# Patient Record
Sex: Male | Born: 1970 | Hispanic: Yes | Marital: Married | Smoking: Light tobacco smoker
Health system: Southern US, Community
[De-identification: ages and names within clinical notes are randomized; demographics above are authoritative.]

## PROBLEM LIST (undated history)

## (undated) DIAGNOSIS — E785 Hyperlipidemia, unspecified: Secondary | ICD-10-CM

## (undated) DIAGNOSIS — E119 Type 2 diabetes mellitus without complications: Secondary | ICD-10-CM

## (undated) DIAGNOSIS — I10 Essential (primary) hypertension: Secondary | ICD-10-CM

## (undated) HISTORY — DX: Hyperlipidemia, unspecified: E78.5

## (undated) HISTORY — DX: Essential (primary) hypertension: I10

## (undated) HISTORY — DX: Type 2 diabetes mellitus without complications: E11.9

---
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
## 2012-11-05 ENCOUNTER — Emergency Department (HOSPITAL_COMMUNITY): Payer: No Typology Code available for payment source

## 2012-11-05 ENCOUNTER — Emergency Department (HOSPITAL_COMMUNITY)
Admission: EM | Admit: 2012-11-05 | Discharge: 2012-11-06 | Disposition: A | Discharge status: Home / Self Care | Payer: Self-pay | Attending: Emergency Medicine | Admitting: Emergency Medicine

## 2012-11-05 ENCOUNTER — Encounter (HOSPITAL_COMMUNITY): Payer: Self-pay | Admitting: Emergency Medicine

## 2012-11-05 DIAGNOSIS — S40011A Contusion of right shoulder, initial encounter: Secondary | ICD-10-CM

## 2012-11-05 DIAGNOSIS — Y92410 Unspecified street and highway as the place of occurrence of the external cause: Secondary | ICD-10-CM | POA: Insufficient documentation

## 2012-11-05 DIAGNOSIS — S40019A Contusion of unspecified shoulder, initial encounter: Secondary | ICD-10-CM | POA: Insufficient documentation

## 2012-11-05 DIAGNOSIS — Y9389 Activity, other specified: Secondary | ICD-10-CM | POA: Insufficient documentation

## 2012-11-05 DIAGNOSIS — F17200 Nicotine dependence, unspecified, uncomplicated: Secondary | ICD-10-CM | POA: Insufficient documentation

## 2012-11-05 MED ORDER — IBUPROFEN 200 MG PO TABS
600.0000 mg | ORAL_TABLET | Freq: Once | ORAL | Status: AC
  Administered 2012-11-05: 600 mg via ORAL
  Filled 2012-11-05: qty 3
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
## 2012-11-06 MED ORDER — IBUPROFEN 600 MG PO TABS: 600.0000 mg | ORAL_TABLET | Freq: Four times a day (QID) | ORAL | Status: AC | PRN

## 2012-11-06 NOTE — ED Provider Notes (Signed)
Medical screening examination/treatment/procedure(s) were performed by non-physician practitioner and as supervising physician I was immediately available for consultation/collaboration.    Vida Roller, MD 11/06/12 2001

## 2015-02-27 DIAGNOSIS — I10 Essential (primary) hypertension: Secondary | ICD-10-CM

## 2015-02-27 HISTORY — DX: Essential (primary) hypertension: I10

## 2015-03-27 DIAGNOSIS — E785 Hyperlipidemia, unspecified: Secondary | ICD-10-CM

## 2015-03-27 DIAGNOSIS — E119 Type 2 diabetes mellitus without complications: Secondary | ICD-10-CM | POA: Insufficient documentation

## 2015-03-27 HISTORY — DX: Type 2 diabetes mellitus without complications: E11.9

## 2015-03-27 HISTORY — DX: Hyperlipidemia, unspecified: E78.5

## 2015-04-04 ENCOUNTER — Encounter: Payer: Self-pay | Admitting: Internal Medicine

## 2015-04-04 ENCOUNTER — Ambulatory Visit (INDEPENDENT_AMBULATORY_CARE_PROVIDER_SITE_OTHER): Payer: Self-pay | Admitting: Internal Medicine

## 2015-04-04 VITALS — BP 152/90 | HR 76 | Temp 98.4°F | Ht 63.5 in | Wt 151.5 lb

## 2015-04-04 DIAGNOSIS — R29898 Other symptoms and signs involving the musculoskeletal system: Secondary | ICD-10-CM

## 2015-04-04 DIAGNOSIS — R358 Other polyuria: Secondary | ICD-10-CM

## 2015-04-04 DIAGNOSIS — R3589 Other polyuria: Secondary | ICD-10-CM

## 2015-04-04 DIAGNOSIS — R030 Elevated blood-pressure reading, without diagnosis of hypertension: Secondary | ICD-10-CM

## 2015-04-04 DIAGNOSIS — G6289 Other specified polyneuropathies: Secondary | ICD-10-CM

## 2015-04-04 DIAGNOSIS — IMO0001 Reserved for inherently not codable concepts without codable children: Secondary | ICD-10-CM

## 2015-04-04 DIAGNOSIS — R5382 Chronic fatigue, unspecified: Secondary | ICD-10-CM

## 2015-04-04 DIAGNOSIS — R7611 Nonspecific reaction to tuberculin skin test without active tuberculosis: Secondary | ICD-10-CM

## 2015-04-04 NOTE — Patient Instructions (Signed)
[facility]ighter[facility] Naranja

## 2015-04-04 NOTE — Progress Notes (Signed)
Subjective:    Patient ID: Todd Foster, male    DOB: October 27, 1970, 45 y.o.   MRN: [id number]  HPI  1.  Feet have been burning and itching for 6-8 months.  Not peeling or flaking.  Notes this the most when trying to sleep.  Nothing seems to make this better.  Has this 2-3 times weekly.   6 months ago, was thirsty much of time, but stopped eating chocolate and now no longer bothers him.  Was urinating a lot 6 months ago as well.  Polyuria has also resolved since stopped eating chocolate.   He was eating 2-3 chocolate donuts/baked goods daily then.   Has lost 5 lbs since stopping the chocolate baked goods.   Mother has diabetes. No history of alcoholism--though drank more 5 years ago. Drinks once monthly Does not eat vegetables much Does eat a lot of fruit. Fish, shrimp, chicken, beef--previously fried, but has decreased in recent months.  2.  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  He never had the Chest Xray done and never followed up as was recommended.  He is concerned about this today as he wants to know if it has anything to do with his foot symptoms.  No cough or fever, no dyspnea.  3.  Knee Popping:  Does not have pain with the popping.    4.  Concern for Erectile Dysfunction:   Was having intercourse with wife 5 times weekly until last year.  In last year, only twice weekly.  His wife is not interested in having intercourse more often.  He admits to not being able to "concentrate"  At times, possibly because of this.  Does not masturbate to know if can have an erection otherwise.  He also has less interest in sex and in past year, feels more tired.  5.  Elevated BP:  Never noted to have elevated BP before, but has not been to a doctor.   Meds:  None  Allergies:  NKDA  Review of Systems     Objective:   Physical Exam  Constitutional: He appears well-developed and well-nourished.  HENT:  Head: Normocephalic and atraumatic.  Right Ear: Tympanic membrane and  external ear normal.  Left Ear: Tympanic membrane and external ear normal.  Nose: Nose normal.  Mouth/Throat: Oropharynx is clear and moist. Dental caries present.  Dental decay and periodontal disease  Eyes: Conjunctivae and EOM are normal. Pupils are equal, round, and reactive to light.  Discs sharp bilaterally.  No AV nicking  Neck: Normal range of motion. Neck supple. No thyroid mass and no thyromegaly present.  Cardiovascular: Normal rate, regular rhythm, S1 normal and S2 normal.  PMI is not displaced.  Exam reveals no S3, no S4 and no friction rub.   No murmur heard. No carotid bruits.  Carotid, radial, femoral, DP and PT pulses normal and equal.    Pulmonary/Chest: Effort normal and breath sounds normal. He has no wheezes.  Abdominal: Soft. Bowel sounds are normal. He exhibits no mass. There is no hepatosplenomegaly. There is no tenderness. No hernia. Hernia confirmed negative in the right inguinal area and confirmed negative in the left inguinal area.  Genitourinary: Testes normal and penis normal. Uncircumcised. No penile tenderness.  Erection during exam  Musculoskeletal: Normal range of motion. He exhibits no edema or tenderness.  Knees with full ROM, NT, NO crepitation, no effusion  Lymphadenopathy:    He has no cervical adenopathy.  Right: No inguinal adenopathy present.       Left: No inguinal adenopathy present.  Neurological: He is alert.  NOrmal 10 g monofilament testing of feet bilaterally  Skin: Skin is warm and dry.  Feet look healthy with good coloring and no deformity of feet or digits  Balding on head, but good amount of hair on chest, abdomen, pubic, leg and arm areas.          Assessment & Plan:  1.  History of +PPD:  Will call PHD--they may be able to get CXR for patient before can get him in for [facility].  Will Call PHD to confirm.  2.  Burning Feet:  Not sure if this is a peripheral neuropathy:  Check CBC, CMP, A1C, TSH  3.  Elevated BP:   Nurse visit for recheck in 1 month Follow up with me in 2 months  4.  Fatigue:  No findings on exam.  Labs as noted above  5.  Complaints regarding frequency of intercourse:  No findings on exam.  Do not feel this is likely due to ED.  6.  Knee popping:  No findings on exam.  Explained this can be normal.  Pt. It asymptomatic.

## 2015-04-05 LAB — COMPREHENSIVE METABOLIC PANEL
ALT: 23 IU/L (ref 0–44)
AST: 15 IU/L (ref 0–40)
Albumin/Globulin Ratio: 1.6 (ref 1.1–2.5)
Albumin: 4.7 g/dL (ref 3.5–5.5)
Alkaline Phosphatase: 87 IU/L (ref 39–117)
BUN/Creatinine Ratio: 15 (ref 9–20)
BUN: 12 mg/dL (ref 6–24)
Bilirubin Total: 1.4 mg/dL — ABNORMAL HIGH (ref 0.0–1.2)
CALCIUM: 9.6 mg/dL (ref 8.7–10.2)
CO2: 24 mmol/L (ref 18–29)
Chloride: 93 mmol/L — ABNORMAL LOW (ref 96–106)
Creatinine, Ser: 0.82 mg/dL (ref 0.76–1.27)
GFR calc Af Amer: 126 mL/min/{1.73_m2} (ref >59)
GFR, EST NON AFRICAN AMERICAN: 109 mL/min/{1.73_m2} (ref >59)
GLOBULIN, TOTAL: 2.9 g/dL (ref 1.5–4.5)
Glucose: 374 mg/dL — ABNORMAL HIGH (ref 65–99)
POTASSIUM: 4 mmol/L (ref 3.5–5.2)
SODIUM: 135 mmol/L (ref 134–144)
Total Protein: 7.6 g/dL (ref 6.0–8.5)

## 2015-04-05 LAB — CBC WITH DIFFERENTIAL/PLATELET
BASOS: 0 %
Basophils Absolute: 0 10*3/uL (ref 0.0–0.2)
EOS (ABSOLUTE): 0.1 10*3/uL (ref 0.0–0.4)
EOS: 1 %
HEMATOCRIT: 48.4 % (ref 37.5–51.0)
HEMOGLOBIN: 16.7 g/dL (ref 12.6–17.7)
IMMATURE GRANULOCYTES: 0 %
Immature Grans (Abs): 0 10*3/uL (ref 0.0–0.1)
Lymphocytes Absolute: 1.8 10*3/uL (ref 0.7–3.1)
Lymphs: 35 %
MCH: 30.2 pg (ref 26.6–33.0)
MCHC: 34.5 g/dL (ref 31.5–35.7)
MCV: 88 fL (ref 79–97)
MONOS ABS: 0.3 10*3/uL (ref 0.1–0.9)
Monocytes: 5 %
NEUTROS PCT: 59 %
Neutrophils Absolute: 3 10*3/uL (ref 1.4–7.0)
Platelets: 210 10*3/uL (ref 150–379)
RBC: 5.53 x10E6/uL (ref 4.14–5.80)
RDW: 13.5 % (ref 12.3–15.4)
WBC: 5.1 10*3/uL (ref 3.4–10.8)

## 2015-04-05 LAB — TSH: TSH: 1.43 u[IU]/mL (ref 0.450–4.500)

## 2015-04-05 LAB — HGB A1C W/O EAG: HEMOGLOBIN A1C: 11.1 % — AB (ref 4.8–5.6)

## 2015-04-11 ENCOUNTER — Encounter: Payer: Self-pay | Admitting: Internal Medicine

## 2015-04-25 ENCOUNTER — Encounter: Payer: Self-pay | Admitting: Internal Medicine

## 2015-04-25 ENCOUNTER — Ambulatory Visit (INDEPENDENT_AMBULATORY_CARE_PROVIDER_SITE_OTHER): Payer: Self-pay | Admitting: Internal Medicine

## 2015-04-25 VITALS — BP 120/80 | HR 70 | Resp 18 | Ht 64.0 in | Wt 149.0 lb

## 2015-04-25 DIAGNOSIS — R7611 Nonspecific reaction to tuberculin skin test without active tuberculosis: Secondary | ICD-10-CM

## 2015-04-25 DIAGNOSIS — E119 Type 2 diabetes mellitus without complications: Secondary | ICD-10-CM | POA: Insufficient documentation

## 2015-04-25 LAB — GLUCOSE, POCT (MANUAL RESULT ENTRY): POC GLUCOSE: 324 mg/dL — AB (ref 70–99)

## 2015-04-25 MED ORDER — GLUCOSE BLOOD VI STRP: ORAL_STRIP | Status: AC

## 2015-04-25 MED ORDER — AGAMATRIX PRESTO W/DEVICE KIT: PACK | Status: AC

## 2015-04-25 MED ORDER — LISINOPRIL 2.5 MG PO TABS: 2.5000 mg | ORAL_TABLET | Freq: Every day | ORAL | Status: DC

## 2015-04-25 MED ORDER — METFORMIN HCL 500 MG PO TABS: 500.0000 mg | ORAL_TABLET | Freq: Two times a day (BID) | ORAL | Status: DC

## 2015-04-25 MED ORDER — GLIPIZIDE 5 MG PO TABS: 5.0000 mg | ORAL_TABLET | Freq: Two times a day (BID) | ORAL | Status: DC

## 2015-04-25 NOTE — Progress Notes (Addendum)
   Subjective:    Patient ID: Todd Foster, male    DOB: 24-Aug-1970, 45 y.o.   MRN: [id number]  HPI  1.  New onset NIDDM:  He has really worked on stopping his soda intake and sweets intake.  He has felt better since doing so.  He had made many of these changes, however, before his last visit with abnormal labs. A1C 3 weeks ago was 11.1% with sugars above 300.    His renal and liver function was normal.  2.  Elevated BP:  Today is fine.  Discussed ACE I.  Meds:  Ibuprofen as needed  No Known Allergies  Review of Systems     Objective:   Physical Exam  NAD Healthy appearing male Exam unchanged      Assessment & Plan:  1.  New Onset DM:  Hour long face to face discussion regarding reasons for good glucose control, complication, treatment options, including importance of maintaining good physical activity and good diet. Start Glipizide 5 mg twice daily and Metformin 500 mg twice daily.   Peanut butter cracker pack with him at all times should he have a sugar low. Follow up for bp check and BMP in 1 week Followup with me in 1 month. To get orange card so [facility] and for yearly eye check Start low dose ACE I with intermittent elevation of bp:  Lisinopril 2.5 mg daily  2.  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  [facility].

## 2015-04-25 NOTE — Addendum Note (Signed)
Addended by: Marcene DuosMULBERRY, Mychele Seyller M on: 04/25/2015 02:19 PM   Modules accepted: Orders

## 2015-05-02 ENCOUNTER — Ambulatory Visit: Payer: Self-pay

## 2015-05-02 VITALS — BP 120/80 | HR 80

## 2015-05-02 NOTE — Progress Notes (Unsigned)
Started Lisinopril 2.5 mg daily on 04/25/2015.  No problems with the medicine.  BP is at acceptable level B. Delrae AlfredMulberry, M.D. Documenting this--unable to open under my sign on today. BMP to be drawn today as well.

## 2015-05-03 LAB — BASIC METABOLIC PANEL
BUN / CREAT RATIO: 20 (ref 9–20)
BUN: 13 mg/dL (ref 6–24)
CO2: 23 mmol/L (ref 18–29)
Calcium: 9.5 mg/dL (ref 8.7–10.2)
Chloride: 99 mmol/L (ref 96–106)
Creatinine, Ser: 0.66 mg/dL — ABNORMAL LOW (ref 0.76–1.27)
GFR calc Af Amer: 138 mL/min/{1.73_m2} (ref >59)
GFR calc non Af Amer: 119 mL/min/{1.73_m2} (ref >59)
GLUCOSE: 169 mg/dL — AB (ref 65–99)
Potassium: 4.6 mmol/L (ref 3.5–5.2)
Sodium: 137 mmol/L (ref 134–144)

## 2015-05-06 ENCOUNTER — Telehealth: Payer: Self-pay | Admitting: Internal Medicine

## 2015-05-06 NOTE — Telephone Encounter (Signed)
Tammy of TB Department needs advise regarding TB Testing of patient- Documentation regarding  old skin test (history) of positive testing.  Please call her at [phone] (her direct line).

## 2015-06-06 ENCOUNTER — Encounter: Payer: Self-pay | Admitting: Internal Medicine

## 2015-06-06 ENCOUNTER — Ambulatory Visit (INDEPENDENT_AMBULATORY_CARE_PROVIDER_SITE_OTHER): Payer: Self-pay | Admitting: Internal Medicine

## 2015-06-06 VITALS — BP 118/76 | HR 68 | Resp 16 | Ht 64.0 in | Wt 154.0 lb

## 2015-06-06 DIAGNOSIS — B351 Tinea unguium: Secondary | ICD-10-CM

## 2015-06-06 DIAGNOSIS — E119 Type 2 diabetes mellitus without complications: Secondary | ICD-10-CM

## 2015-06-06 DIAGNOSIS — I10 Essential (primary) hypertension: Secondary | ICD-10-CM

## 2015-06-06 DIAGNOSIS — Z23 Encounter for immunization: Secondary | ICD-10-CM

## 2015-06-06 NOTE — Progress Notes (Signed)
   Subjective:    Patient ID: Todd Foster, male    DOB: [birth year], 45 y.o.   MRN: [id number]  HPI   1.  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  Playing phone tag with PHD.  Call back to them.  Discussed with patient need to be seen by TB clinic.  No night sweats or fever. No weight loss or cough.   2.  DM Type 2:  Taking Metformin 500 mg twice daily and Glipizide 5 mg twice daily.  Tolerating medication well.  No polyuria.  No polydipsia.   Stopped eating sweets as much.   No soda or juice.   Is eating starches without fiber often. Did not obtain glucometer and test strips so not checking sugars. Still with mild itching in feet, but burning is better. Is not checking feet nightly Has not had Pneumovax. Did not get Influenza immunization Has not obtained orange card yet, so cannot get test strips, glucometer, or eye referral yet.   3.  Essential Hypertension:  Lisinopril 2.5 mg daily.     Current outpatient prescriptions:  .  glipiZIDE (GLUCOTROL) 5 MG tablet, Take 1 tablet (5 mg total) by mouth 2 (two) times daily before a meal., Disp: 60 tablet, Rfl: 11 .  lisinopril (PRINIVIL,ZESTRIL) 2.5 MG tablet, Take 1 tablet (2.5 mg total) by mouth daily., Disp: 30 tablet, Rfl: 11 .  metFORMIN (GLUCOPHAGE) 500 MG tablet, Take 1 tablet (500 mg total) by mouth 2 (two) times daily with a meal., Disp: 60 tablet, Rfl: 11 .  Blood Glucose Monitoring Suppl (AGAMATRIX PRESTO) w/Device KIT, Check blood sugar twice daily (Patient not taking: Reported on 06/06/2015), Disp: 1 kit, Rfl: 0 .  glucose blood (AGAMATRIX PRESTO TEST) test strip, Check blood sugar twice daily (Patient not taking: Reported on 06/06/2015), Disp: 100 each, Rfl: 11 .  ibuprofen (ADVIL,MOTRIN) 600 MG tablet, Take 1 tablet (600 mg total) by mouth every 6 (six) hours as needed for pain. (Patient not taking: Reported on 04/04/2015), Disp: 30 tablet, Rfl: 0   No Known Allergies  Review of Systems     Objective:   Physical Exam    NAD Lungs:  CTA CV:  RRR with normal S1 and S2, No S3, S4, or murmur, radial and DP pulses normal and equal Abd:  S, NT, No HSM or mass, + BS LE:  No edema.   Feet with some flaking about toes and plantar surface, some flaking in between toes.  10 g monofilament testing normal.  Good cap refill.  No concerning deformities.      Assessment & Plan:  1.  History of + PPD about 18 years ago.  Needs to go to PHD for evaluation and treatment  2.  DM Type 2:  Strongly urged patient to get set up with orange card.  Otherwise cannot get affordable glucometer and test strips to follow control.  Too early for repeat A1C.   Unable to send for eye referral as no orange card. Encouraged yearly influenza in the fall. Pneumovax 23 v today.  3.  Essential Hypertension:  Continue Lisinopril low dose  4.  Mild toenail onychomycosis and tinea pedis:  Topical OTC Terbinafine 1% twice daily for 14 days and then to utilize Tea Tree Oil to feet once daily after bath.

## 2015-06-06 NOTE — Patient Instructions (Signed)
Terbinafine crema dos veces al dia por todo el pie para 14 dias.  Cuando termina Terbinafine crema, comienza Tea Tree oil crema por todo el pia una vez al dia despues de banarse

## 2015-08-26 ENCOUNTER — Telehealth: Payer: Self-pay

## 2015-08-26 NOTE — Telephone Encounter (Signed)
Patient needs to be seen for a PPD skin test to be placed soon.

## 2015-09-04 NOTE — Telephone Encounter (Signed)
Patient scheduled for Tb skin test 09/09/15 at 10:00 AM

## 2015-09-05 ENCOUNTER — Ambulatory Visit: Payer: Self-pay | Admitting: Internal Medicine

## 2015-09-09 ENCOUNTER — Ambulatory Visit (INDEPENDENT_AMBULATORY_CARE_PROVIDER_SITE_OTHER): Payer: Self-pay

## 2015-09-09 DIAGNOSIS — Z111 Encounter for screening for respiratory tuberculosis: Secondary | ICD-10-CM

## 2015-09-09 NOTE — Progress Notes (Signed)
PPD skin test placed on patient's left forearm on 09/09/2015 at 9:40 AM with patient's consent. Patient instructed to return Wednesday afternoon to have the PPD read.

## 2015-09-11 LAB — TB SKIN TEST: TB SKIN TEST: POSITIVE

## 2015-09-12 ENCOUNTER — Encounter: Payer: Self-pay | Admitting: Internal Medicine

## 2015-09-12 ENCOUNTER — Ambulatory Visit (INDEPENDENT_AMBULATORY_CARE_PROVIDER_SITE_OTHER): Payer: Self-pay | Admitting: Internal Medicine

## 2015-09-12 VITALS — BP 112/64 | HR 76 | Resp 16 | Ht 63.25 in | Wt 155.0 lb

## 2015-09-12 DIAGNOSIS — K029 Dental caries, unspecified: Secondary | ICD-10-CM

## 2015-09-12 DIAGNOSIS — I10 Essential (primary) hypertension: Secondary | ICD-10-CM

## 2015-09-12 DIAGNOSIS — E119 Type 2 diabetes mellitus without complications: Secondary | ICD-10-CM

## 2015-09-12 DIAGNOSIS — B353 Tinea pedis: Secondary | ICD-10-CM

## 2015-09-12 LAB — GLUCOSE, POCT (MANUAL RESULT ENTRY): POC Glucose: 118 mg/dl — AB (ref 70–99)

## 2015-09-12 NOTE — Progress Notes (Addendum)
   Subjective:    Patient ID: Todd Foster, male    DOB: 1970-04-11, 45 y.o.   MRN: [id number]  HPI   1.  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  Pt. Recently returned to have repeated and was positive at 10 mm.  Never had any follow. No cough or problems with breathing.     2.  DM Type 2:  Is due for A1C.  Did not get orange card until 06/25/2015.  Did not get glucometer until just recently and just instructed on use this morning.  No sugars to know how he is doing. No polydipsia or polyuria.   Feels well.   No eye exam yet.  3. Pins and needles in feet when sleeps.  Minimal and not every night.    4.  Tinea pedis:  Terbinafine cream worked well for him.  Essentially resolved.  Did not get tea tree oil.  States he could not find.    5.  Essential Hypertension:  Controlled.  No problems with med.  6.  Dental:  Would like referral  Current Meds  Medication Sig  . Blood Glucose Monitoring Suppl (AGAMATRIX PRESTO) w/Device KIT Check blood sugar twice daily  . glipiZIDE (GLUCOTROL) 5 MG tablet Take 1 tablet (5 mg total) by mouth 2 (two) times daily before a meal.  . glucose blood (AGAMATRIX PRESTO TEST) test strip Check blood sugar twice daily  . ibuprofen (ADVIL,MOTRIN) 600 MG tablet Take 1 tablet (600 mg total) by mouth every 6 (six) hours as needed for pain.  Marland Kitchen lisinopril (PRINIVIL,ZESTRIL) 2.5 MG tablet Take 1 tablet (2.5 mg total) by mouth daily.  . metFORMIN (GLUCOPHAGE) 500 MG tablet Take 1 tablet (500 mg total) by mouth 2 (two) times daily with a meal.    No Known Allergies   Immunization History  Administered Date(s) Administered  . PPD Test 09/11/2015  . Pneumococcal Polysaccharide-23 06/06/2015      Review of Systems     Objective:   Physical Exam  AD Looks better today--well groomed HEENT:  PERRL, EOMI, discs s Chest:  CTA CV:  RRR without murmur or rub, radial pulses normal and equal Diabetic Foot Exam - Simple   Simple Foot Form Diabetic Foot exam was  performed with the following findings:  Yes 09/12/2015  3:30 PM  Visual Inspection Sensation Testing Pulse Check Comments Flaking of plantar feet resolved   10 g monofilament testing normal       Assessment & Plan:  1.  DM, Type 2:  Looks well today, but no sugars to know if significantly improved control  Check A1C Send for diabetic eye exam. Recommend flu vaccine in later fall. Nonfasting today, so hold on checking status of cholesterol May have mild peripheral neuropathy.  2.  Essential Hypertension:  Controlled  3.  Tinea Pedis:  Resolved  4.  Dental Decay:  Dental referral.  5.  + PPD:  Referral in process for [facility] clinic.  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

## 2015-09-13 LAB — HGB A1C W/O EAG: Hgb A1c MFr Bld: 6 % — ABNORMAL HIGH (ref 4.8–5.6)

## 2015-10-02 ENCOUNTER — Encounter: Payer: Self-pay | Admitting: Internal Medicine

## 2015-10-02 DIAGNOSIS — B353 Tinea pedis: Secondary | ICD-10-CM | POA: Insufficient documentation

## 2015-10-02 DIAGNOSIS — I10 Essential (primary) hypertension: Secondary | ICD-10-CM | POA: Insufficient documentation

## 2015-10-02 DIAGNOSIS — K029 Dental caries, unspecified: Secondary | ICD-10-CM | POA: Insufficient documentation

## 2015-10-04 ENCOUNTER — Ambulatory Visit
Admission: RE | Admit: 2015-10-04 | Discharge: 2015-10-04 | Disposition: A | Discharge status: Home / Self Care | Payer: No Typology Code available for payment source | Source: Ambulatory Visit | Attending: Infectious Disease | Admitting: Infectious Disease

## 2015-10-04 ENCOUNTER — Other Ambulatory Visit: Payer: Self-pay | Admitting: Infectious Disease

## 2015-10-04 DIAGNOSIS — R7611 Nonspecific reaction to tuberculin skin test without active tuberculosis: Secondary | ICD-10-CM

## 2015-10-25 ENCOUNTER — Telehealth: Payer: Self-pay | Admitting: Internal Medicine

## 2015-10-25 NOTE — Telephone Encounter (Signed)
Received faxed records from [facility]:  CXR was normal.  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

## 2015-10-25 NOTE — Telephone Encounter (Signed)
Latent TB X-ray results received 10/25/15

## 2015-10-30 NOTE — Telephone Encounter (Signed)
Patient has been contacted by the [facility]. 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

## 2015-12-12 ENCOUNTER — Ambulatory Visit (INDEPENDENT_AMBULATORY_CARE_PROVIDER_SITE_OTHER): Payer: Self-pay | Admitting: Internal Medicine

## 2015-12-12 ENCOUNTER — Encounter: Payer: Self-pay | Admitting: Internal Medicine

## 2015-12-12 VITALS — BP 122/70 | HR 74 | Resp 12 | Ht 63.0 in | Wt 158.0 lb

## 2015-12-12 DIAGNOSIS — E119 Type 2 diabetes mellitus without complications: Secondary | ICD-10-CM

## 2015-12-12 DIAGNOSIS — R7611 Nonspecific reaction to tuberculin skin test without active tuberculosis: Secondary | ICD-10-CM

## 2015-12-12 DIAGNOSIS — Z13220 Encounter for screening for lipoid disorders: Secondary | ICD-10-CM

## 2015-12-12 DIAGNOSIS — Z23 Encounter for immunization: Secondary | ICD-10-CM

## 2015-12-12 DIAGNOSIS — I10 Essential (primary) hypertension: Secondary | ICD-10-CM

## 2015-12-12 LAB — GLUCOSE, POCT (MANUAL RESULT ENTRY): POC Glucose: 154 mg/dl — AB (ref 70–99)

## 2015-12-12 NOTE — Progress Notes (Signed)
   Subjective:    Patient ID: Todd Foster, male    DOB: [birth year], 45 y.o.   MRN: [id number]  HPI   1. +PPD:  Sounds like undergoing treatment for 3 months.  Did not bring in med.  Taking 2 tabs at bedtime.  Has one more month left of treatment.  2. DM:  A1C dropped to 6.0% from  11.1% in the spring.  In ams sugars are running 120-130.  150-160 in afternoon.  Had eye check reportedly normal last month. Did not come for influenza clinic and currently out of vaccine. Up to date on pneumococcal vaccine Still with mild numbness and tingling in feet.  Does not cause much discomfort Here to check fasting lipids. Not taking 81 mg of ASA daily   3. Essential Hypertension:  Not taking Lisinopril--felt he was taking too many pills.  Current Meds  Medication Sig  . Blood Glucose Monitoring Suppl (AGAMATRIX PRESTO) w/Device KIT Check blood sugar twice daily  . glipiZIDE (GLUCOTROL) 5 MG tablet Take 1 tablet (5 mg total) by mouth 2 (two) times daily before a meal.  . glucose blood (AGAMATRIX PRESTO TEST) test strip Check blood sugar twice daily  . metFORMIN (GLUCOPHAGE) 500 MG tablet Take 1 tablet (500 mg total) by mouth 2 (two) times daily with a meal.   No Known Allergies     Review of Systems     Objective:   Physical Exam  Constitutional: He appears well-developed and well-nourished.  HENT:  Head: Normocephalic and atraumatic.  Eyes: EOM are normal. Pupils are equal, round, and reactive to light.  Neck: Normal range of motion. Neck supple.  Cardiovascular: Normal rate, regular rhythm and intact distal pulses.  Exam reveals no friction rub.   No murmur heard. Pulmonary/Chest: Effort normal and breath sounds normal.  LE:  No edema       Assessment & Plan:  1.  DM:  Well controlled.  As his sugars are a little higher in the evening, will hold on weaning Glipizide.  Encouraged patient to write down and bring in sugars at each visit.  2.  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  3.  Essential Hypertension:  Encouraged to stay on low dose Lisinopril. Encouraged looking for influenza vaccine--PHD, church or retail pharmacy.  Call back in a couple of weeks to see if we have more.  4.  HM:  Tdap today.

## 2015-12-13 LAB — LIPID PANEL W/O CHOL/HDL RATIO
Cholesterol, Total: 190 mg/dL (ref 100–199)
HDL: 66 mg/dL (ref >39)
LDL CALC: 107 mg/dL — AB (ref 0–99)
TRIGLYCERIDES: 84 mg/dL (ref 0–149)
VLDL Cholesterol Cal: 17 mg/dL (ref 5–40)

## 2015-12-27 NOTE — Progress Notes (Signed)
Pt. Informed of results. Fasting lab scheduled for 03/26/16 @ 9AM

## 2016-03-26 ENCOUNTER — Other Ambulatory Visit (INDEPENDENT_AMBULATORY_CARE_PROVIDER_SITE_OTHER): Payer: Self-pay

## 2016-03-26 DIAGNOSIS — Z79899 Other long term (current) drug therapy: Secondary | ICD-10-CM

## 2016-03-26 DIAGNOSIS — Z13220 Encounter for screening for lipoid disorders: Secondary | ICD-10-CM

## 2016-03-26 DIAGNOSIS — E119 Type 2 diabetes mellitus without complications: Secondary | ICD-10-CM

## 2016-03-27 LAB — LIPID PANEL W/O CHOL/HDL RATIO
CHOLESTEROL TOTAL: 191 mg/dL (ref 100–199)
HDL: 59 mg/dL (ref >39)
LDL Calculated: 118 mg/dL — ABNORMAL HIGH (ref 0–99)
Triglycerides: 72 mg/dL (ref 0–149)
VLDL Cholesterol Cal: 14 mg/dL (ref 5–40)

## 2016-03-27 LAB — COMPREHENSIVE METABOLIC PANEL
ALBUMIN: 4.5 g/dL (ref 3.5–5.5)
ALT: 25 IU/L (ref 0–44)
AST: 18 IU/L (ref 0–40)
Albumin/Globulin Ratio: 1.6 (ref 1.2–2.2)
Alkaline Phosphatase: 62 IU/L (ref 39–117)
BUN / CREAT RATIO: 21 — AB (ref 9–20)
BUN: 16 mg/dL (ref 6–24)
Bilirubin Total: 0.7 mg/dL (ref 0.0–1.2)
CO2: 23 mmol/L (ref 18–29)
Calcium: 9.4 mg/dL (ref 8.7–10.2)
Chloride: 101 mmol/L (ref 96–106)
Creatinine, Ser: 0.76 mg/dL (ref 0.76–1.27)
GFR calc non Af Amer: 112 mL/min/{1.73_m2} (ref >59)
GFR, EST AFRICAN AMERICAN: 129 mL/min/{1.73_m2} (ref >59)
GLOBULIN, TOTAL: 2.9 g/dL (ref 1.5–4.5)
GLUCOSE: 173 mg/dL — AB (ref 65–99)
Potassium: 4.7 mmol/L (ref 3.5–5.2)
SODIUM: 140 mmol/L (ref 134–144)
TOTAL PROTEIN: 7.4 g/dL (ref 6.0–8.5)

## 2016-03-27 LAB — HGB A1C W/O EAG: Hgb A1c MFr Bld: 6.5 % — ABNORMAL HIGH (ref 4.8–5.6)

## 2016-04-07 MED ORDER — ATORVASTATIN CALCIUM 20 MG PO TABS: 20.0000 mg | ORAL_TABLET | Freq: Every day | ORAL | 11 refills | Status: AC

## 2016-04-09 ENCOUNTER — Ambulatory Visit (INDEPENDENT_AMBULATORY_CARE_PROVIDER_SITE_OTHER): Payer: Self-pay | Admitting: Internal Medicine

## 2016-04-09 ENCOUNTER — Encounter: Payer: Self-pay | Admitting: Internal Medicine

## 2016-04-09 VITALS — BP 102/70 | HR 76 | Resp 12 | Ht 64.0 in | Wt 154.0 lb

## 2016-04-09 DIAGNOSIS — E119 Type 2 diabetes mellitus without complications: Secondary | ICD-10-CM

## 2016-04-09 DIAGNOSIS — I10 Essential (primary) hypertension: Secondary | ICD-10-CM

## 2016-04-09 DIAGNOSIS — E785 Hyperlipidemia, unspecified: Secondary | ICD-10-CM

## 2016-04-09 DIAGNOSIS — R7611 Nonspecific reaction to tuberculin skin test without active tuberculosis: Secondary | ICD-10-CM

## 2016-04-09 NOTE — Progress Notes (Signed)
   Subjective:    Patient ID: Todd Foster, male    DOB: [birth year], 46 y.o.   MRN: [id number]  HPI   1.  DM:  Recent A1C good at 6.5%.  Did go to eye doctor 2 months ago.  Was told exam was normal.    Checking feet nightly.  No concerns. Unable to get fingerstick at home easily--only checking sugars 2-3 times per week.  Unable to obtain today as well.   Discussed need in future to get an influenza vaccine in the fall. Taking low dose Lisinopril regularly now.  2.  Dyslipidemia with high LDL at 118.  Goal 70 .  Has not picked up Atorvastatin as of yet.  Will pick up today.  3.  Latent TB:  Finished treatment at [facility]  Was treated for 4 months.     Current Meds  Medication Sig  . Blood Glucose Monitoring Suppl (AGAMATRIX PRESTO) w/Device KIT Check blood sugar twice daily  . glipiZIDE (GLUCOTROL) 5 MG tablet Take 1 tablet (5 mg total) by mouth 2 (two) times daily before a meal.  . glucose blood (AGAMATRIX PRESTO TEST) test strip Check blood sugar twice daily  . lisinopril (PRINIVIL,ZESTRIL) 2.5 MG tablet Take 1 tablet (2.5 mg total) by mouth daily.  . metFORMIN (GLUCOPHAGE) 500 MG tablet Take 1 tablet (500 mg total) by mouth 2 (two) times daily with a meal.    No Known Allergies    Review of Systems     Objective:   Physical Exam  NAD Lungs: CTA CV:  RRR without murmur or rub,  Radial and DP pulses normal and equal.  LE:  No edema        Assessment & Plan:  1.  DM:  Good control.  Discussed the possibility of weaning Glipizide and increasing Metformin, but he is fine with his current regimen and it seems to be working well for him. Encourage influenza vaccine in fall--to call to find out when we hold clinics.  2.  Essential Hypertension:  Low dose Lisinopril  3.  Dyslipidemia:  Has not picked up Atorvastatin.  Will do so today.  Has appt. For FLP/Hepatic profile in 6 weeks.  Discussed good fats in diet and increasing physicial activity.  4. Latent TB:  finished treatment through [facility].

## 2016-04-10 LAB — MICROALBUMIN / CREATININE URINE RATIO
Creatinine, Urine: 151.7 mg/dL
Microalb/Creat Ratio: 6.2 mg/g creat (ref 0.0–30.0)
Microalbumin, Urine: 9.4 ug/mL

## 2016-05-11 ENCOUNTER — Other Ambulatory Visit: Payer: Self-pay | Admitting: Internal Medicine

## 2016-05-11 DIAGNOSIS — E119 Type 2 diabetes mellitus without complications: Secondary | ICD-10-CM

## 2016-05-21 ENCOUNTER — Other Ambulatory Visit: Payer: Self-pay

## 2016-06-02 ENCOUNTER — Other Ambulatory Visit (INDEPENDENT_AMBULATORY_CARE_PROVIDER_SITE_OTHER): Payer: Self-pay

## 2016-06-02 DIAGNOSIS — Z79899 Other long term (current) drug therapy: Secondary | ICD-10-CM

## 2016-06-02 DIAGNOSIS — E785 Hyperlipidemia, unspecified: Secondary | ICD-10-CM

## 2016-06-03 LAB — HEPATIC FUNCTION PANEL
ALBUMIN: 4.7 g/dL (ref 3.5–5.5)
ALT: 32 IU/L (ref 0–44)
AST: 20 IU/L (ref 0–40)
Alkaline Phosphatase: 71 IU/L (ref 39–117)
Bilirubin Total: 0.9 mg/dL (ref 0.0–1.2)
Bilirubin, Direct: 0.21 mg/dL (ref 0.00–0.40)
Total Protein: 7.4 g/dL (ref 6.0–8.5)

## 2016-06-03 LAB — LIPID PANEL W/O CHOL/HDL RATIO
CHOLESTEROL TOTAL: 196 mg/dL (ref 100–199)
HDL: 60 mg/dL (ref >39)
LDL Calculated: 120 mg/dL — ABNORMAL HIGH (ref 0–99)
Triglycerides: 79 mg/dL (ref 0–149)
VLDL Cholesterol Cal: 16 mg/dL (ref 5–40)

## 2016-06-15 NOTE — Progress Notes (Signed)
Increasing dose of Atorvastatin to 40 mg daily.

## 2016-08-06 ENCOUNTER — Ambulatory Visit: Payer: Self-pay | Admitting: Internal Medicine

## 2016-08-13 ENCOUNTER — Encounter: Payer: Self-pay | Admitting: Internal Medicine

## 2016-08-13 ENCOUNTER — Ambulatory Visit (INDEPENDENT_AMBULATORY_CARE_PROVIDER_SITE_OTHER): Payer: Self-pay | Admitting: Internal Medicine

## 2016-08-13 VITALS — BP 118/72 | HR 70 | Resp 12 | Ht 64.0 in | Wt 158.0 lb

## 2016-08-13 DIAGNOSIS — E119 Type 2 diabetes mellitus without complications: Secondary | ICD-10-CM

## 2016-08-13 DIAGNOSIS — I10 Essential (primary) hypertension: Secondary | ICD-10-CM

## 2016-08-13 DIAGNOSIS — E785 Hyperlipidemia, unspecified: Secondary | ICD-10-CM

## 2016-08-13 DIAGNOSIS — Z114 Encounter for screening for human immunodeficiency virus [HIV]: Secondary | ICD-10-CM

## 2016-08-13 DIAGNOSIS — Z79899 Other long term (current) drug therapy: Secondary | ICD-10-CM

## 2016-08-13 LAB — GLUCOSE, POCT (MANUAL RESULT ENTRY): POC Glucose: 151 mg/dl — AB (ref 70–99)

## 2016-08-13 NOTE — Progress Notes (Signed)
   Subjective:    Patient ID: Todd Foster, male    DOB: Oct 20, 1970, 46 y.o.   MRN: [id number]  HPI   1.  Dyslipidemia:  States taking Atorvastatin 20 mg daily. States has not stopped after one bottle as he did previously.   Lipid Panel     Component Value Date/Time   CHOL 196 06/02/2016 0924   TRIG 79 06/02/2016 0924   HDL 60 06/02/2016 0924   LDLCALC 120 (H) 06/02/2016 0924    2.  DM:  Last A1C was 6.5% 3/1.  States sugars running between 140 and 160 at home.  Checking 2-3 times per week, always in the morning before breakfast.   Checking feet daily with no concerning findings.  3.  HM:  Has not had CPE in lifetime.    Current Meds  Medication Sig  . atorvastatin (LIPITOR) 20 MG tablet Take 1 tablet (20 mg total) by mouth daily.  . Blood Glucose Monitoring Suppl (AGAMATRIX PRESTO) w/Device KIT Check blood sugar twice daily  . glipiZIDE (GLUCOTROL) 5 MG tablet TAKE ONE TABLET BY MOUTH TWICE DAILY BEFORE  A  MEAL  . glucose blood (AGAMATRIX PRESTO TEST) test strip Check blood sugar twice daily  . lisinopril (PRINIVIL,ZESTRIL) 2.5 MG tablet TAKE ONE TABLET BY MOUTH ONCE DAILY  . metFORMIN (GLUCOPHAGE) 500 MG tablet TAKE ONE TABLET BY MOUTH TWICE DAILY WITH  A  MEAL    No Known Allergies       Review of Systems     Objective:   Physical Exam  Lungs:  CTA CV:  RRR without murmur or rub.  Radial and DP pulses normal and equal. Abd: S, NT, No HSM or mass, + BS LE:  No edema         Assessment & Plan:  1.  Dyslipidemia:  Reportedly taking Atorvastatin regularly.  Recommended taking with evening meal.  FLP, hepatic profile  2.  DM:  Has been well controlled.  Eye exam, foot exam up to date.  Immunizations up to date.  To follow up with call in September for yearly flu vaccine and hopefully oral cancer screen with orange card sign up.  A1C  3.  Essential Hypertension/microalbuminuria:  Controlled with low dose Lisinopril.  Will recheck urine albumin with CPE  in November  4.  HM:  Screen for HIV.  CPE in 4 months.

## 2016-08-13 NOTE — Patient Instructions (Signed)
Call for influenza vaccine schedule in September and for oral cancer screen.

## 2016-08-14 LAB — HIV ANTIBODY (ROUTINE TESTING W REFLEX): HIV SCREEN 4TH GENERATION: NONREACTIVE

## 2016-08-14 LAB — HEPATIC FUNCTION PANEL
ALT: 38 IU/L (ref 0–44)
AST: 25 IU/L (ref 0–40)
Albumin: 4.5 g/dL (ref 3.5–5.5)
Alkaline Phosphatase: 80 IU/L (ref 39–117)
BILIRUBIN, DIRECT: 0.17 mg/dL (ref 0.00–0.40)
Bilirubin Total: 0.5 mg/dL (ref 0.0–1.2)
TOTAL PROTEIN: 7.3 g/dL (ref 6.0–8.5)

## 2016-08-14 LAB — LIPID PANEL W/O CHOL/HDL RATIO
CHOLESTEROL TOTAL: 124 mg/dL (ref 100–199)
HDL: 52 mg/dL (ref >39)
LDL CALC: 60 mg/dL (ref 0–99)
Triglycerides: 62 mg/dL (ref 0–149)
VLDL CHOLESTEROL CAL: 12 mg/dL (ref 5–40)

## 2016-08-14 LAB — HGB A1C W/O EAG: Hgb A1c MFr Bld: 7.4 % — ABNORMAL HIGH (ref 4.8–5.6)

## 2016-12-10 ENCOUNTER — Ambulatory Visit: Payer: Self-pay | Admitting: Internal Medicine

## 2016-12-10 ENCOUNTER — Encounter: Payer: Self-pay | Admitting: Internal Medicine

## 2016-12-10 VITALS — BP 118/82 | HR 80 | Resp 12 | Ht 64.0 in | Wt 159.0 lb

## 2016-12-10 DIAGNOSIS — E119 Type 2 diabetes mellitus without complications: Secondary | ICD-10-CM

## 2016-12-10 DIAGNOSIS — Z0000 Encounter for general adult medical examination without abnormal findings: Secondary | ICD-10-CM

## 2016-12-10 DIAGNOSIS — I10 Essential (primary) hypertension: Secondary | ICD-10-CM

## 2016-12-10 DIAGNOSIS — Z23 Encounter for immunization: Secondary | ICD-10-CM

## 2016-12-10 DIAGNOSIS — E782 Mixed hyperlipidemia: Secondary | ICD-10-CM

## 2016-12-10 DIAGNOSIS — Z79899 Other long term (current) drug therapy: Secondary | ICD-10-CM

## 2016-12-10 LAB — GLUCOSE, POCT (MANUAL RESULT ENTRY): POC GLUCOSE: 102 mg/dL — AB (ref 70–99)

## 2016-12-10 NOTE — Progress Notes (Signed)
Subjective:    Patient ID: Todd Foster, male    DOB: 01-17-1971, 46 y.o.   MRN: [id number]  HPI   Here for Male CPE:  1.  STE:  Does check at least once monthly.  No family history of testicular cancer.  2.  PSA/DRE:  Never.  No family history of prostate cancer.  3.  Guaiac Cards:  Never.    4.  Colonoscopy: Never.  No family history of colon cancer.  5.  Cholesterol/Glucose:  Has DM and hyperlipidemia, both treated.  Cholesterol at goal 08/13/2016.  6.  Immunizations: Needs influenza vaccine. Immunization History  Administered Date(s) Administered  . PPD Test 09/11/2015  . Pneumococcal Polysaccharide-23 06/06/2015  . Tdap 12/12/2015   Current Meds  Medication Sig  . atorvastatin (LIPITOR) 20 MG tablet Take 1 tablet (20 mg total) by mouth daily.  Marland Kitchen. glipiZIDE (GLUCOTROL) 5 MG tablet TAKE ONE TABLET BY MOUTH TWICE DAILY BEFORE  A  MEAL  . lisinopril (PRINIVIL,ZESTRIL) 2.5 MG tablet TAKE ONE TABLET BY MOUTH ONCE DAILY  . metFORMIN (GLUCOPHAGE) 500 MG tablet TAKE ONE TABLET BY MOUTH TWICE DAILY WITH  A  MEAL    No Known Allergies   Past Medical History:  Diagnosis Date  . Diabetes mellitus without complication (HCC) 03/2015  . Hyperlipidemia 03/2015  . Hypertension 02/2015   Past Medical History:  Diagnosis Date  . Diabetes mellitus without complication (HCC) 03/2015  . Hyperlipidemia 03/2015  . Hypertension 02/2015   Past Surgical History:  Procedure Laterality Date  . APPENDECTOMY  1992   Social History   Socioeconomic History  . Marital status: Married    Spouse name: Wellington Hampshirentonia Paz Manzano  . Number of children: 3  . Years of education: 6  . Highest education level: Not on file  Social Needs  . Financial resource strain: Not on file  . Food insecurity - worry: Never true  . Food insecurity - inability: Never true  . Transportation needs - medical: Yes  . Transportation needs - non-medical: Yes  Occupational History  . Occupation: Apartment  Maintenance  Tobacco Use  . Smoking status: Light Tobacco Smoker    Types: Cigarettes    Start date: 08/04/1987  . Smokeless tobacco: Never Used  . Tobacco comment: smokes once in a couple of months--does not really even smoke 1  Substance and Sexual Activity  . Alcohol use: Yes    Alcohol/week: 0.0 oz    Comment: once monthly  . Drug use: No  . Sexual activity: Yes    Birth control/protection: Surgical  Other Topics Concern  . Not on file  Social History Narrative   Originally from [place]   Came to [place] in 1991   Lives at home with wife and 3 children.   Family History  Problem Relation Age of Onset  . Diabetes Mother   . Diabetes Brother     Review of Systems  Constitutional: Negative for appetite change, fatigue, fever and unexpected weight change.  HENT: Negative for dental problem, ear pain, hearing loss, postnasal drip, sinus pain and sneezing.   Eyes: Negative for visual disturbance.       Has not had eye check this year.  Last check October 2017  Respiratory: Negative for cough.   Cardiovascular: Negative for chest pain, palpitations and leg swelling.  Gastrointestinal: Negative for abdominal pain, blood in stool (no melena), constipation and diarrhea.  Genitourinary: Negative for discharge, dysuria, frequency and testicular pain.  Musculoskeletal: Negative for arthralgias.  Skin: Negative for rash.  Neurological: Negative for dizziness, weakness and numbness.  Psychiatric/Behavioral: Negative for dysphoric mood. The patient is not nervous/anxious.        Objective:   Physical Exam  Constitutional: He is oriented to person, place, and time. He appears well-developed and well-nourished.  HENT:  Head: Normocephalic and atraumatic.  Right Ear: Hearing, tympanic membrane, external ear and ear canal normal.  Left Ear: Hearing, tympanic membrane, external ear and ear canal normal.  Nose: Nose normal.  Mouth/Throat: Uvula is midline, oropharynx is clear and  moist and mucous membranes are normal.  Eyes: Conjunctivae and EOM are normal. Pupils are equal, round, and reactive to light.  Discs sharp bilaterally  Neck: Normal range of motion and full passive range of motion without pain. Neck supple. No thyromegaly present.  Cardiovascular: Normal rate, regular rhythm, S1 normal and S2 normal. Exam reveals no S3, no S4 and no friction rub.  No murmur heard. No carotid bruits.  Carotid, radial, femoral, DP and PT pulses normal and equal.   Pulmonary/Chest: Effort normal and breath sounds normal.  Abdominal: Soft. Bowel sounds are normal. He exhibits no mass. There is no hepatosplenomegaly. There is no tenderness. No hernia. Hernia confirmed negative in the right inguinal area and confirmed negative in the left inguinal area.  Genitourinary: Rectum normal, prostate normal and penis normal. Rectal exam shows guaiac negative stool. Right testis shows no mass, no swelling and no tenderness. Right testis is descended. Left testis shows no mass, no swelling and no tenderness. Left testis is descended.  Musculoskeletal: Normal range of motion. He exhibits no edema.  Lymphadenopathy:       Head (right side): No submental and no submandibular adenopathy present.       Head (left side): No submental and no submandibular adenopathy present.    He has no cervical adenopathy.    He has no axillary adenopathy.       Right: No inguinal and no supraclavicular adenopathy present.       Left: No inguinal and no supraclavicular adenopathy present.  Neurological: He is alert and oriented to person, place, and time. He has normal strength and normal reflexes. He displays normal reflexes. No cranial nerve deficit or sensory deficit. Coordination and gait normal.  Diabetic Foot Exam - Simple   Simple Foot Form Diabetic Foot exam was performed with the following findings:  Yes  12/10/2016  3:14 PM  Visual Inspection No deformities, no ulcerations, no other skin breakdown  bilaterally:  Yes See comments:  Yes Sensation Testing Intact to touch and monofilament testing bilaterally:  Yes Pulse Check Posterior Tibialis and Dorsalis pulse intact bilaterally:  Yes Comments Some dryness and flaking at heels and about distal great toes.      Skin: Skin is warm. No rash noted.  Psychiatric: He has a normal mood and affect. His speech is normal and behavior is normal. Judgment and thought content normal. Cognition and memory are normal.          Assessment & Plan:  1.  CPE Is fasting now for a bit over 7 hours:  FLP, CMP, Urine microalbumin/crea, A1C. Influenza vaccine. Guaiac cards x 3, to return in 2 weeks.  2.  DM:  Labs as above.   Optometry referral.  3.  Hyperlipidemia:  Labs as above.

## 2016-12-11 LAB — COMPREHENSIVE METABOLIC PANEL
A/G RATIO: 1.7 (ref 1.2–2.2)
ALK PHOS: 84 IU/L (ref 39–117)
ALT: 24 IU/L (ref 0–44)
AST: 17 IU/L (ref 0–40)
Albumin: 4.8 g/dL (ref 3.5–5.5)
BUN/Creatinine Ratio: 19 (ref 9–20)
BUN: 15 mg/dL (ref 6–24)
Bilirubin Total: 0.9 mg/dL (ref 0.0–1.2)
CO2: 25 mmol/L (ref 20–29)
Calcium: 9.8 mg/dL (ref 8.7–10.2)
Chloride: 100 mmol/L (ref 96–106)
Creatinine, Ser: 0.78 mg/dL (ref 0.76–1.27)
GFR calc Af Amer: 127 mL/min/{1.73_m2} (ref >59)
GFR, EST NON AFRICAN AMERICAN: 110 mL/min/{1.73_m2} (ref >59)
GLOBULIN, TOTAL: 2.8 g/dL (ref 1.5–4.5)
Glucose: 93 mg/dL (ref 65–99)
Potassium: 4.4 mmol/L (ref 3.5–5.2)
SODIUM: 140 mmol/L (ref 134–144)
Total Protein: 7.6 g/dL (ref 6.0–8.5)

## 2016-12-11 LAB — LIPID PANEL W/O CHOL/HDL RATIO
Cholesterol, Total: 179 mg/dL (ref 100–199)
HDL: 47 mg/dL (ref >39)
LDL CALC: 114 mg/dL — AB (ref 0–99)
TRIGLYCERIDES: 91 mg/dL (ref 0–149)
VLDL Cholesterol Cal: 18 mg/dL (ref 5–40)

## 2016-12-11 LAB — HGB A1C W/O EAG: Hgb A1c MFr Bld: 7.4 % — ABNORMAL HIGH (ref 4.8–5.6)

## 2016-12-11 LAB — MICROALBUMIN / CREATININE URINE RATIO
CREATININE, UR: 125.6 mg/dL
MICROALBUM., U, RANDOM: 17 ug/mL
Microalb/Creat Ratio: 13.5 mg/g creat (ref 0.0–30.0)

## 2016-12-17 ENCOUNTER — Ambulatory Visit: Payer: Self-pay | Admitting: Internal Medicine

## 2017-04-05 ENCOUNTER — Other Ambulatory Visit: Payer: Self-pay

## 2017-04-08 ENCOUNTER — Ambulatory Visit: Payer: Self-pay | Admitting: Internal Medicine

## 2017-04-09 ENCOUNTER — Ambulatory Visit: Payer: Self-pay | Admitting: Internal Medicine

## 2017-04-12 ENCOUNTER — Other Ambulatory Visit: Payer: Self-pay

## 2017-04-12 DIAGNOSIS — E782 Mixed hyperlipidemia: Secondary | ICD-10-CM

## 2017-04-12 DIAGNOSIS — E119 Type 2 diabetes mellitus without complications: Secondary | ICD-10-CM

## 2017-04-12 DIAGNOSIS — Z79899 Other long term (current) drug therapy: Secondary | ICD-10-CM

## 2017-04-13 LAB — LIPID PANEL W/O CHOL/HDL RATIO
CHOLESTEROL TOTAL: 204 mg/dL — AB (ref 100–199)
HDL: 58 mg/dL (ref >39)
LDL Calculated: 129 mg/dL — ABNORMAL HIGH (ref 0–99)
Triglycerides: 84 mg/dL (ref 0–149)
VLDL CHOLESTEROL CAL: 17 mg/dL (ref 5–40)

## 2017-04-13 LAB — COMPREHENSIVE METABOLIC PANEL
ALBUMIN: 4.4 g/dL (ref 3.5–5.5)
ALK PHOS: 68 IU/L (ref 39–117)
ALT: 28 IU/L (ref 0–44)
AST: 18 IU/L (ref 0–40)
Albumin/Globulin Ratio: 1.5 (ref 1.2–2.2)
BUN / CREAT RATIO: 15 (ref 9–20)
BUN: 12 mg/dL (ref 6–24)
Bilirubin Total: 1.2 mg/dL (ref 0.0–1.2)
CALCIUM: 9.1 mg/dL (ref 8.7–10.2)
CO2: 23 mmol/L (ref 20–29)
Chloride: 102 mmol/L (ref 96–106)
Creatinine, Ser: 0.8 mg/dL (ref 0.76–1.27)
GFR, EST AFRICAN AMERICAN: 126 mL/min/{1.73_m2} (ref >59)
GFR, EST NON AFRICAN AMERICAN: 109 mL/min/{1.73_m2} (ref >59)
GLOBULIN, TOTAL: 2.9 g/dL (ref 1.5–4.5)
Glucose: 254 mg/dL — ABNORMAL HIGH (ref 65–99)
Potassium: 4.9 mmol/L (ref 3.5–5.2)
SODIUM: 140 mmol/L (ref 134–144)
TOTAL PROTEIN: 7.3 g/dL (ref 6.0–8.5)

## 2017-04-13 LAB — HGB A1C W/O EAG: Hgb A1c MFr Bld: 8.8 % — ABNORMAL HIGH (ref 4.8–5.6)

## 2017-07-30 ENCOUNTER — Other Ambulatory Visit: Payer: Self-pay

## 2017-08-05 ENCOUNTER — Other Ambulatory Visit: Payer: Self-pay

## 2018-02-09 IMAGING — CR DG CHEST 1V
1 series · 1 of 1 positions shown · non-contrast
Comparison: None.

CLINICAL DATA: Positive PPD.

EXAM:
CHEST 1 VIEW

[w chest pa]
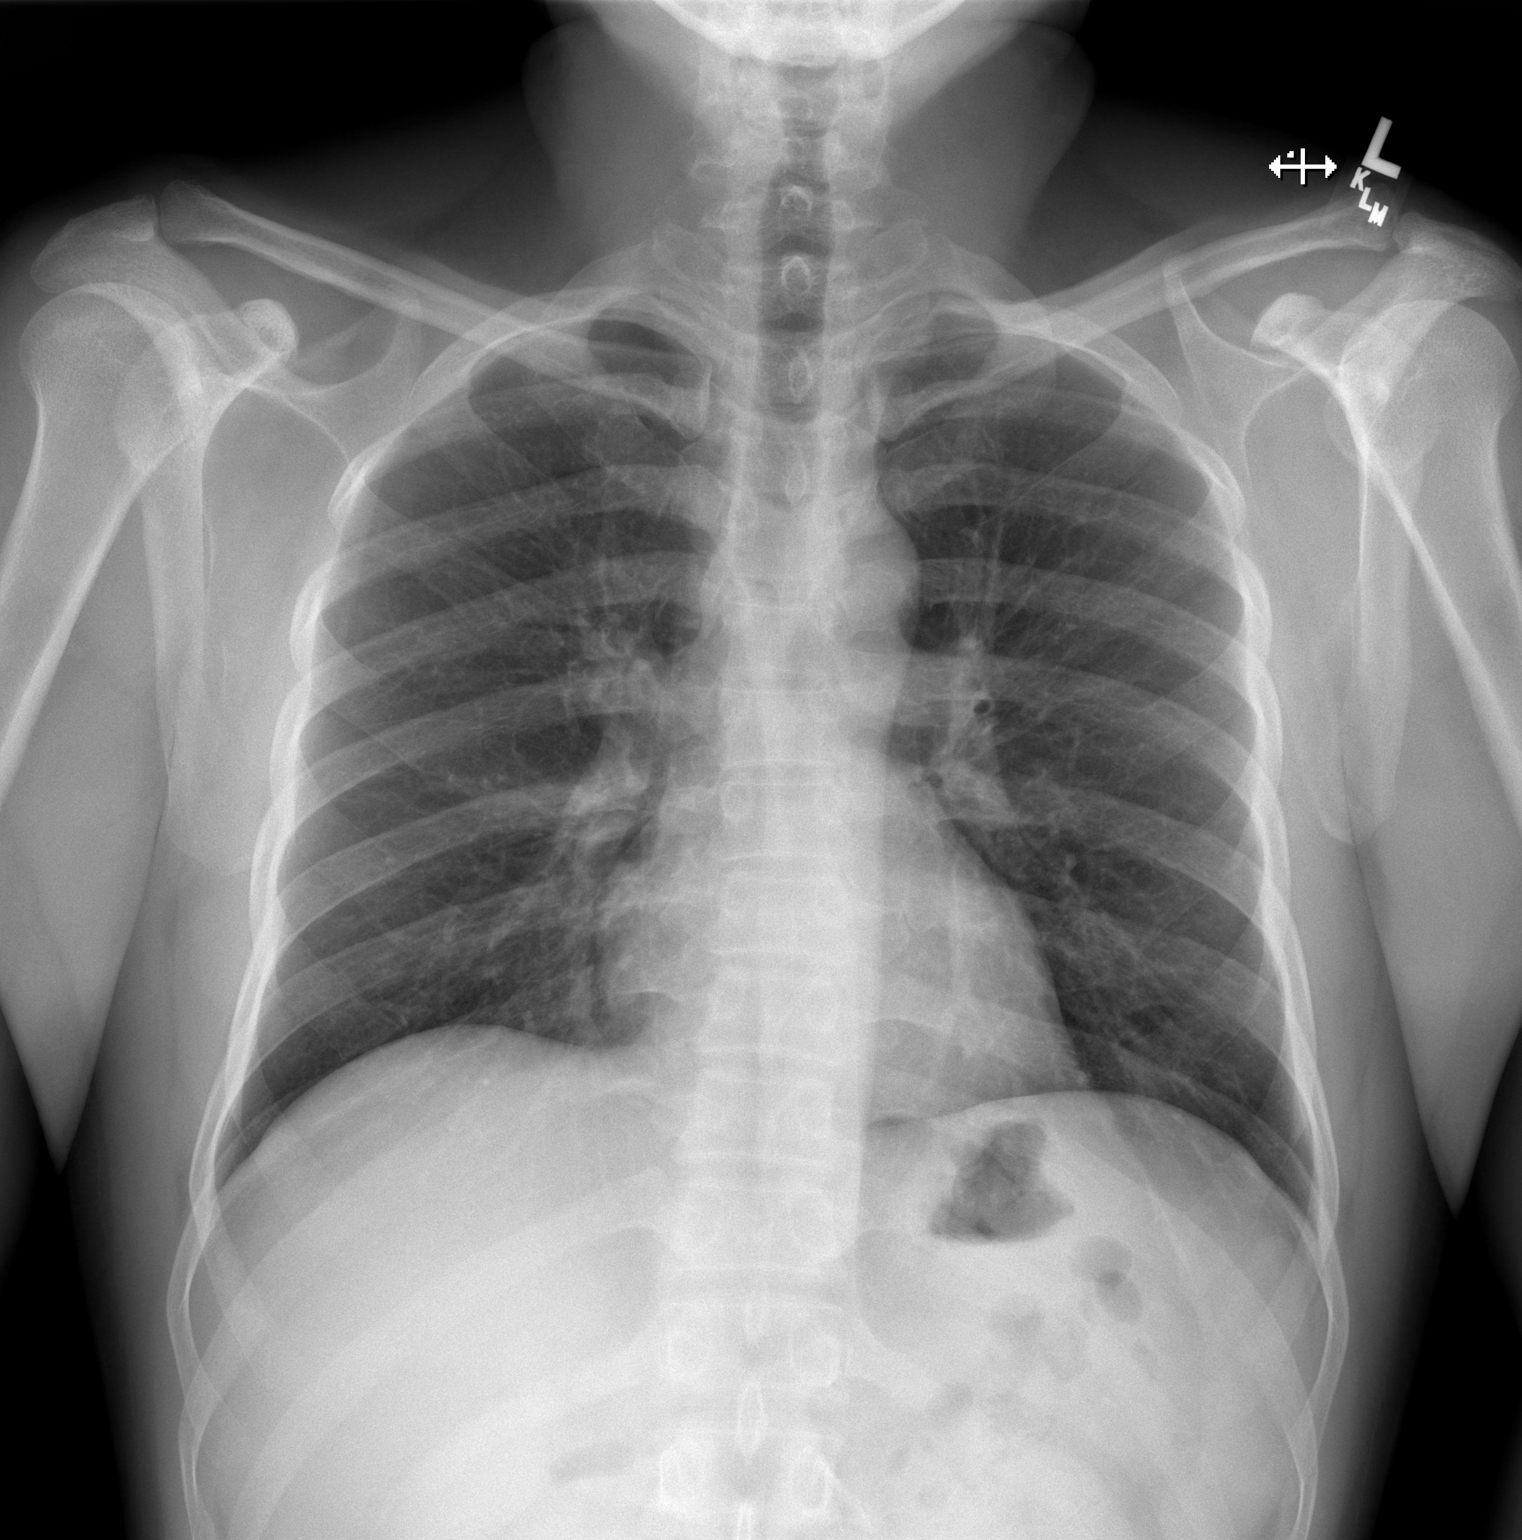

[1 of 1 positions shown; findings below may reference images not displayed]

FINDINGS: The heart size and mediastinal contours are within normal limits.
Both lungs are clear. The visualized skeletal structures are
unremarkable.
IMPRESSION: No active disease.
